# Patient Record
Sex: Female | Born: 1995 | Race: Black or African American | Hispanic: No | Marital: Single | Smoking: Never smoker
Health system: Southern US, Community
[De-identification: ages and names within clinical notes are randomized; demographics above are authoritative.]

## PROBLEM LIST (undated history)

## (undated) DIAGNOSIS — K219 Gastro-esophageal reflux disease without esophagitis: Secondary | ICD-10-CM

## (undated) HISTORY — DX: Gastro-esophageal reflux disease without esophagitis: K21.9

---
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
## 2017-04-29 IMAGING — US US MFM OB FOLLOW-UP
1 series · 14 of 28 positions shown · non-contrast
Comparison: none

[Series 1: us mfm ob follow-up · 34 acquisitions, 14 frames shown]
[im 2/34]
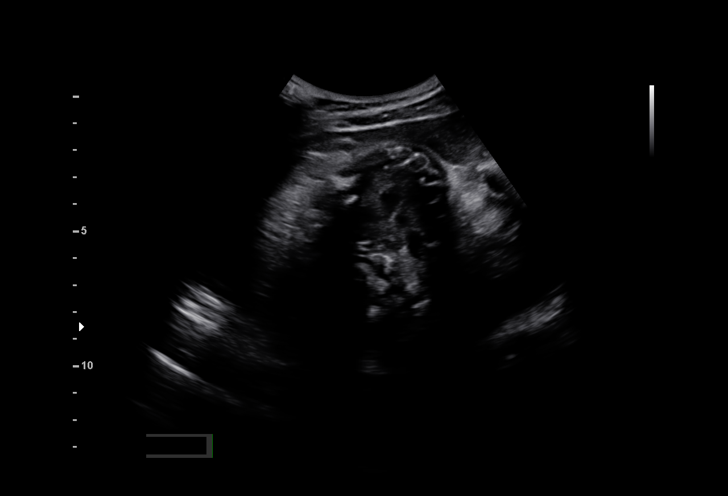
[im 4/34]
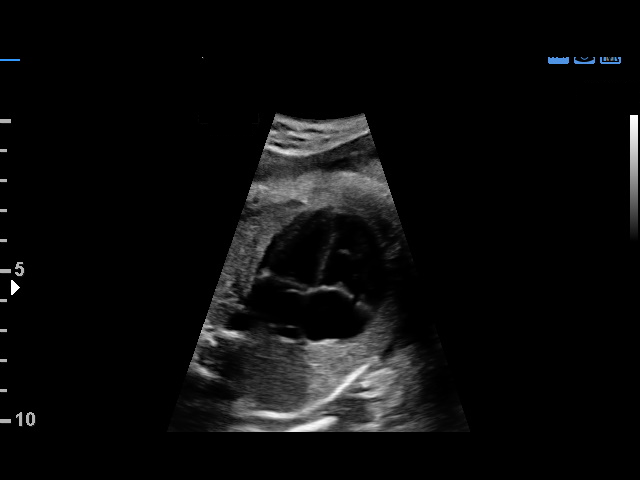
[im 7/34]
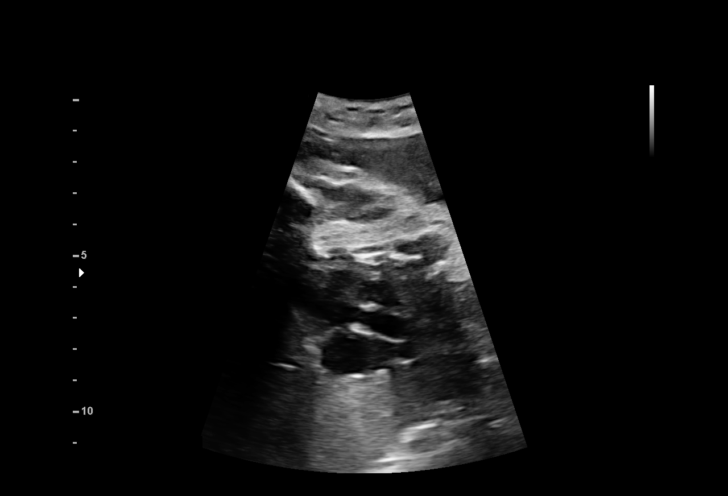
[im 9/34]
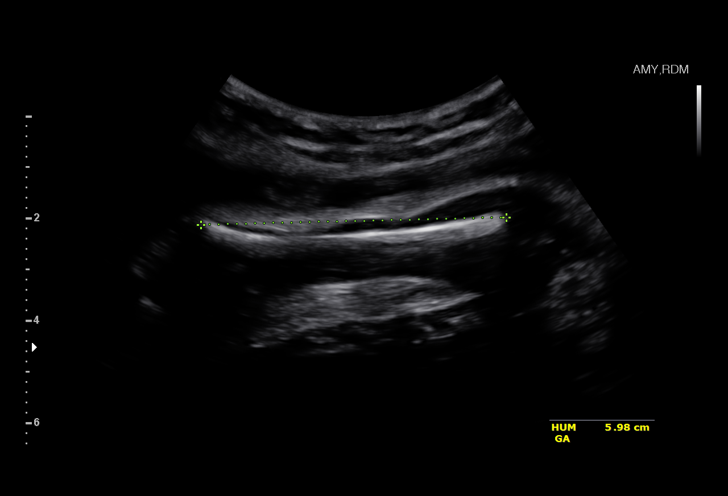
[im 12/34]
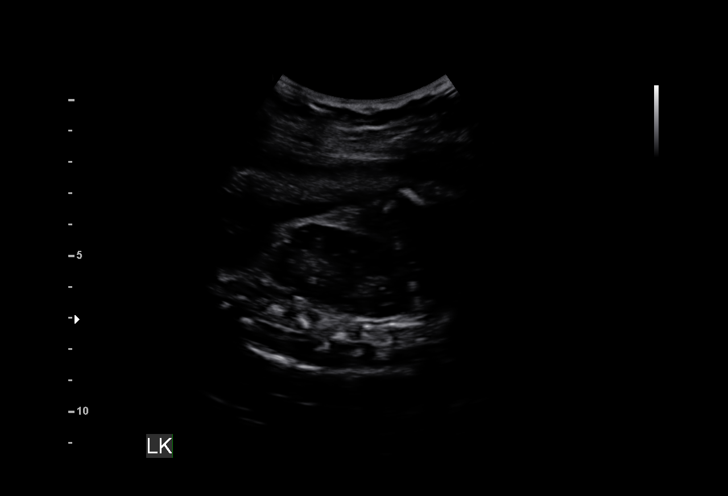
[im 14/34]
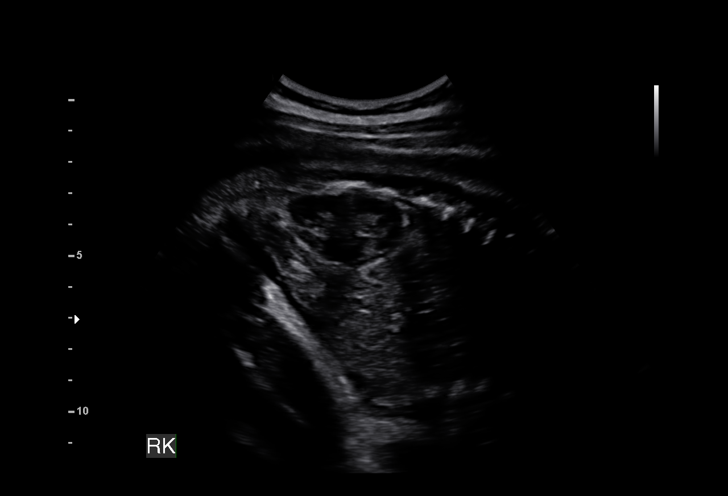
[im 16/34]
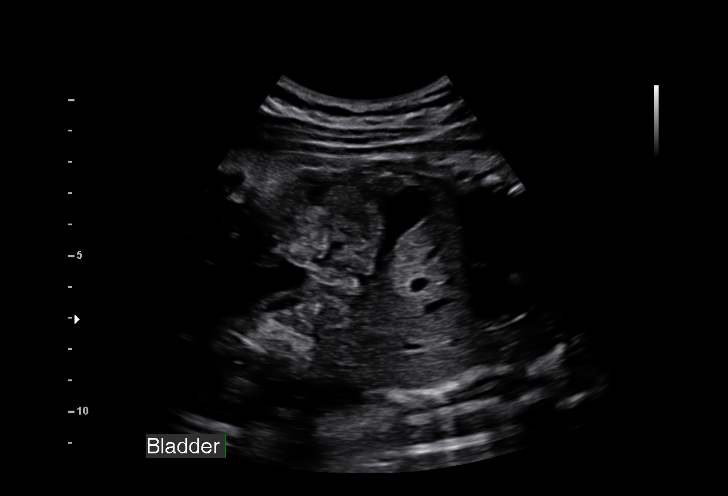
[im 19/34]
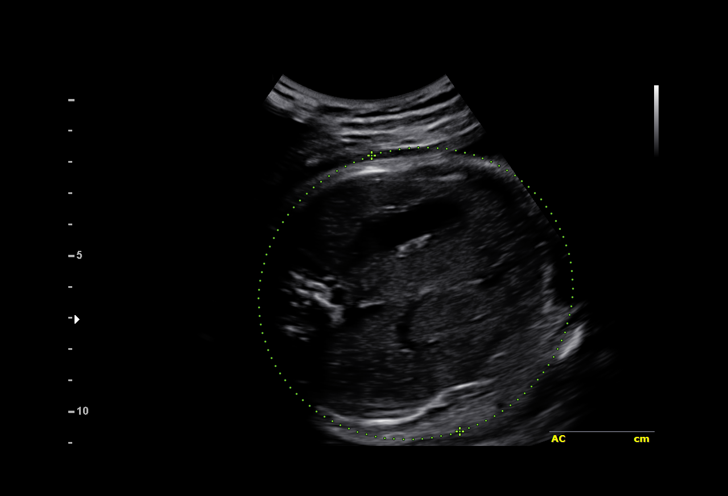
[im 21/34]
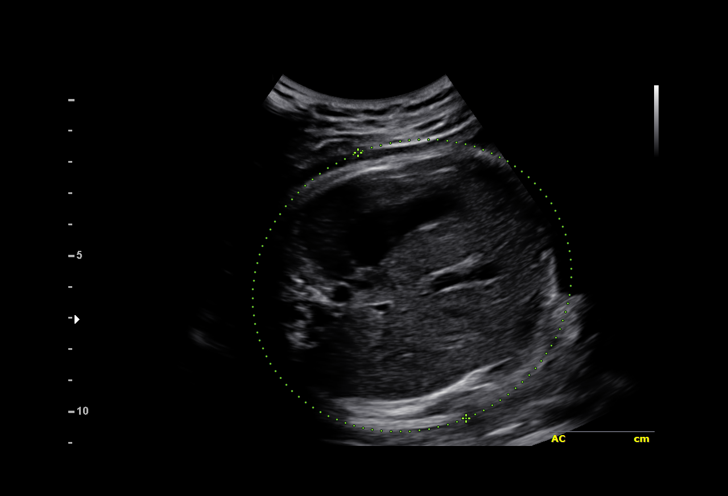
[im 24/34]
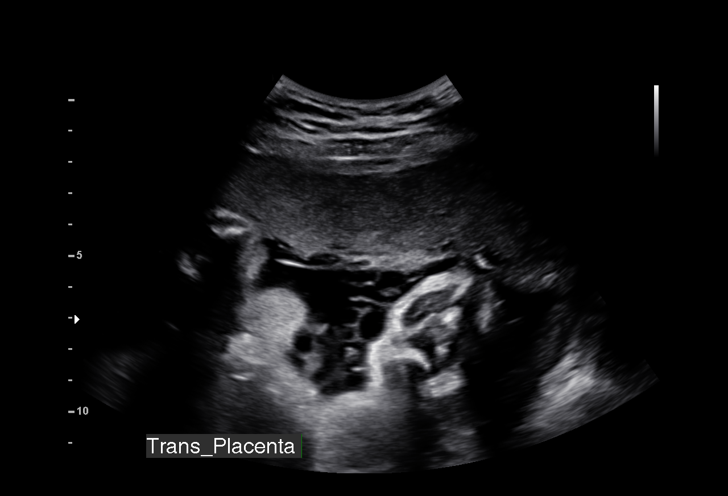
[im 26/34]
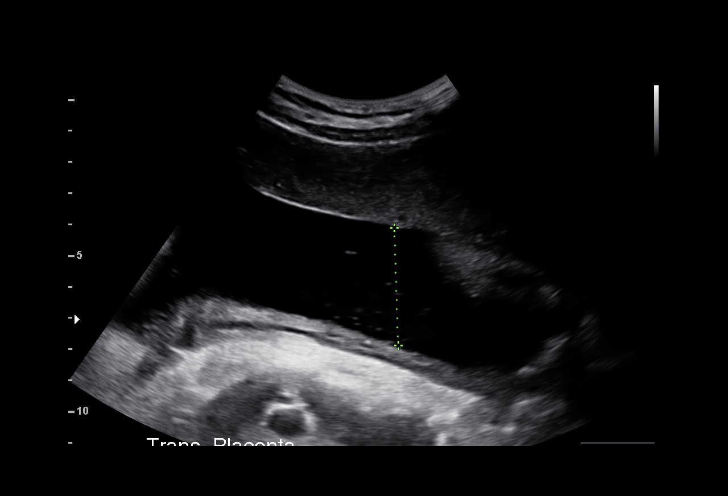
[im 29/34]
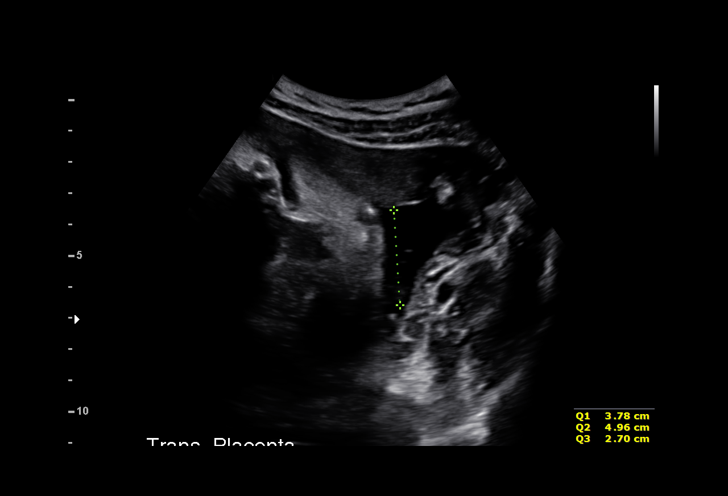
[im 31/34]
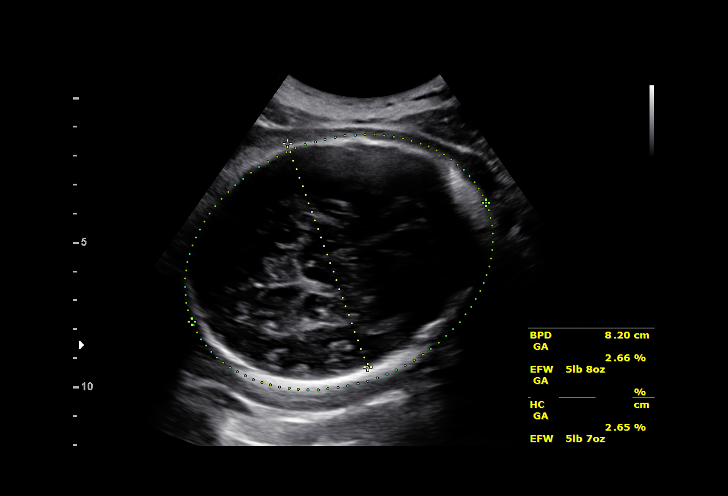
[im 34/34]
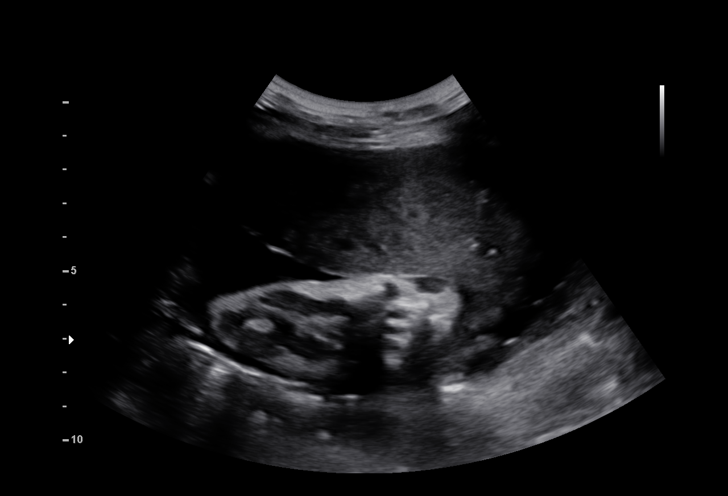

[14 of 28 positions shown; findings below may reference images not displayed]

[facility]
[REDACTED]-
Faculty Physician

Indications

Late to prenatal care, third trimester
36 weeks gestation of pregnancy
Follow-up incomplete fetal anatomic            Z36
evaluation
OB History

Gravidity:    1         Term:   0        Prem:   0         SAB:   0
TOP:          0       Ectopic:  0        Living: 0
Fetal Evaluation

Num Of Fetuses:     1
Fetal Heart         142
Rate(bpm):
Cardiac Activity:   Observed
Presentation:       Cephalic
Placenta:           Anterior, above cervical os
P. Cord Insertion:  Previously Visualized

Amniotic Fluid
AFI FV:      Subjectively within normal limits
AFI Sum:     14.49    cm      53  %Tile      Larg Pckt:   4.96  cm
RUQ:   3.78    cm   RLQ:    3.05   cm    LUQ:   4.96    cm    LLQ:   2.7     cm
Biometry

BPD:      82.2  mm     G. Age:  33w 1d                  CI:         71.28  %    70 - 86
FL/HC:       21.9  %    20.1 -
HC:      310.1  mm     G. Age:  34w 4d          3  %    HC/AC:       1.02       0.93 -
AC:      303.7  mm     G. Age:  34w 3d         14  %    FL/BPD:      82.5  %    71 - 87
FL:       67.8  mm     G. Age:  34w 6d         17  %    FL/AC:       22.3  %    20 - 24
HUM:      59.8  mm     G. Age:  34w 5d         38  %

Est. FW:    0442   gm     5 lb 5 oz     21  %
Gestational Age

LMP:           36w 1d        Date:  11/17/14                 EDD:    08/24/15
U/S Today:     34w 2d                                        EDD:    09/06/15
Best:          36w 1d     Det. By:  LMP  (11/17/14)          EDD:    08/24/15
Anatomy

Cranium:          Previously seen        LVOT:             Appears normal
Fetal Cavum:      Previously seen        Aortic Arch:      Previously seen
Ventricles:       Previously seen        Ductal Arch:      Not well visualized
Choroid Plexus:   Previously seen        Diaphragm:        Appears normal
Cerebellum:       Previously seen        Stomach:          Appears normal, left
sided
Posterior Fossa:  Previously seen        Abdomen:          Previously seen
Nuchal Fold:      Not applicable (>20    Abdominal Wall:   Previously seen
wks GA)
Face:             Appears normal         Cord Vessels:     Previously seen
(orbits and profile)
Lips:             Not well visualized    Kidneys:          Appear normal
Palate:           Appears normal         Bladder:          Appears normal
Fetal Thoracic:   Previously seen        Spine:            Previously seen
Heart:            Appears normal         Upper             Previously seen
(4CH, axis, and        Extremities:
situs)
RVOT:             Not well visualized    Lower             Previously seen
Extremities:

Other:  Female gender.
Cervix Uterus Adnexa

Cervix
Not visualized (advanced GA >80wks)
Impression

SIUP at 36+1 weeks
Cephalic presentation
Normal interval anatomy; limited views of upper lip, RVOT
and DA
Normal amniotic fluid volume
Appropriate interval growth with EFW at the 21st %tile; AC at
the 14th %tile
Recommendations

Follow-up as clinically indicated
